# Patient Record
Sex: Female | Born: 1985 | Race: White | Hispanic: No | Marital: Single | Smoking: Never smoker
Health system: Southern US, Community
[De-identification: ages and names within clinical notes are randomized; demographics above are authoritative.]

---

## 2017-01-02 ENCOUNTER — Inpatient Hospital Stay (HOSPITAL_COMMUNITY): Admit: 2017-01-02 | Payer: Self-pay | Admitting: Psychiatry
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
## 2017-01-03 ENCOUNTER — Inpatient Hospital Stay (HOSPITAL_COMMUNITY)
Admission: AD | Admit: 2017-01-03 | Discharge: 2017-01-05 | DRG: 885 | Disposition: A | Discharge status: Home / Self Care | Payer: Medicaid Other | Source: Intra-hospital | Attending: Psychiatry | Admitting: Psychiatry

## 2017-01-03 ENCOUNTER — Encounter (HOSPITAL_COMMUNITY): Payer: Self-pay | Admitting: General Practice

## 2017-01-03 DIAGNOSIS — E669 Obesity, unspecified: Secondary | ICD-10-CM | POA: Diagnosis present

## 2017-01-03 DIAGNOSIS — D6862 Lupus anticoagulant syndrome: Secondary | ICD-10-CM | POA: Diagnosis present

## 2017-01-03 DIAGNOSIS — F419 Anxiety disorder, unspecified: Secondary | ICD-10-CM

## 2017-01-03 DIAGNOSIS — Z6830 Body mass index (BMI) 30.0-30.9, adult: Secondary | ICD-10-CM

## 2017-01-03 DIAGNOSIS — D6859 Other primary thrombophilia: Secondary | ICD-10-CM | POA: Diagnosis present

## 2017-01-03 DIAGNOSIS — Z7901 Long term (current) use of anticoagulants: Secondary | ICD-10-CM | POA: Diagnosis not present

## 2017-01-03 DIAGNOSIS — M329 Systemic lupus erythematosus, unspecified: Secondary | ICD-10-CM | POA: Diagnosis present

## 2017-01-03 DIAGNOSIS — R45851 Suicidal ideations: Secondary | ICD-10-CM | POA: Diagnosis present

## 2017-01-03 DIAGNOSIS — G4700 Insomnia, unspecified: Secondary | ICD-10-CM | POA: Diagnosis not present

## 2017-01-03 DIAGNOSIS — F319 Bipolar disorder, unspecified: Principal | ICD-10-CM | POA: Diagnosis present

## 2017-01-03 DIAGNOSIS — Z86711 Personal history of pulmonary embolism: Secondary | ICD-10-CM

## 2017-01-03 DIAGNOSIS — F410 Panic disorder [episodic paroxysmal anxiety] without agoraphobia: Secondary | ICD-10-CM | POA: Diagnosis present

## 2017-01-03 DIAGNOSIS — F411 Generalized anxiety disorder: Secondary | ICD-10-CM | POA: Diagnosis present

## 2017-01-03 DIAGNOSIS — Z86718 Personal history of other venous thrombosis and embolism: Secondary | ICD-10-CM | POA: Diagnosis not present

## 2017-01-03 DIAGNOSIS — Z882 Allergy status to sulfonamides status: Secondary | ICD-10-CM

## 2017-01-03 DIAGNOSIS — Z818 Family history of other mental and behavioral disorders: Secondary | ICD-10-CM | POA: Diagnosis not present

## 2017-01-03 DIAGNOSIS — J45909 Unspecified asthma, uncomplicated: Secondary | ICD-10-CM | POA: Diagnosis present

## 2017-01-03 DIAGNOSIS — F39 Unspecified mood [affective] disorder: Secondary | ICD-10-CM

## 2017-01-03 DIAGNOSIS — Z915 Personal history of self-harm: Secondary | ICD-10-CM | POA: Diagnosis not present

## 2017-01-03 MED ORDER — TRAZODONE HCL 50 MG PO TABS
50.0000 mg | ORAL_TABLET | Freq: Every evening | ORAL | Status: DC | PRN
  Administered 2017-01-03: 50 mg via ORAL
  Filled 2017-01-03: qty 1

## 2017-01-03 MED ORDER — MAGNESIUM HYDROXIDE 400 MG/5ML PO SUSP: 30.0000 mL | Freq: Every day | ORAL | Status: DC | PRN

## 2017-01-03 MED ORDER — ACETAMINOPHEN 325 MG PO TABS: 650.0000 mg | ORAL_TABLET | Freq: Four times a day (QID) | ORAL | Status: DC | PRN

## 2017-01-03 MED ORDER — LORAZEPAM 0.5 MG PO TABS
0.5000 mg | ORAL_TABLET | Freq: Four times a day (QID) | ORAL | Status: DC | PRN
  Administered 2017-01-04: 0.5 mg via ORAL
  Filled 2017-01-03: qty 1

## 2017-01-03 MED ORDER — ARIPIPRAZOLE 5 MG PO TABS
5.0000 mg | ORAL_TABLET | Freq: Every day | ORAL | Status: DC
Start: 2017-01-03 — End: 2017-01-05
  Administered 2017-01-03 – 2017-01-05 (×3): 5 mg via ORAL
  Filled 2017-01-03 (×2): qty 1
  Filled 2017-01-03: qty 7
  Filled 2017-01-03 (×3): qty 1

## 2017-01-03 MED ORDER — BUSPIRONE HCL 5 MG PO TABS
5.0000 mg | ORAL_TABLET | Freq: Three times a day (TID) | ORAL | Status: DC
  Administered 2017-01-03 – 2017-01-05 (×7): 5 mg via ORAL
  Filled 2017-01-03: qty 1
  Filled 2017-01-03: qty 21
  Filled 2017-01-03 (×2): qty 1
  Filled 2017-01-03: qty 21
  Filled 2017-01-03 (×4): qty 1
  Filled 2017-01-03: qty 21
  Filled 2017-01-03 (×3): qty 1

## 2017-01-03 MED ORDER — ALUM & MAG HYDROXIDE-SIMETH 200-200-20 MG/5ML PO SUSP: 30.0000 mL | ORAL | Status: DC | PRN

## 2017-01-03 MED ORDER — ENOXAPARIN SODIUM 40 MG/0.4ML ~~LOC~~ SOLN
40.0000 mg | Freq: Every day | SUBCUTANEOUS | Status: DC
  Administered 2017-01-03 – 2017-01-05 (×3): 40 mg via SUBCUTANEOUS
  Filled 2017-01-03 (×5): qty 0.4
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
## 2017-01-03 NOTE — Progress Notes (Signed)
ANTICOAGULATION CONSULT NOTE - Follow Up Consult  Pharmacy Consult for lovenox Indication: DVT prophylaxis  Allergies  Allergen Reactions  . Sulfa Antibiotics Anaphylaxis    Patient Measurements: Height: 5\' 4"  (162.6 cm) Weight: 239 lb (108.4 kg) IBW/kg (Calculated) : 54.7   Vital Signs: Temp: 98.9 F (37.2 C) (09/12 1037) Temp Source: Oral (09/12 1037) BP: 110/68 (09/12 1038) Pulse Rate: 95 (09/12 1038)  Labs: No results for input(s): HGB, HCT, PLT, APTT, LABPROT, INR, HEPARINUNFRC, HEPRLOWMOCWT, CREATININE, CKTOTAL, CKMB, TROPONINI in the last 72 hours.  CrCl cannot be calculated (No order found.).   Medications:  Scheduled:  . ARIPiprazole  5 mg Oral Daily  . busPIRone  5 mg Oral TID  . enoxaparin (LOVENOX) injection  40 mg Subcutaneous Daily    Assessment: Patient on lovenox at home    Plan:  Continue home lovenox 40 mg sq daily   Charyl Dancerayne, Arayah Krouse Marie 01/03/2017,3:01 PM
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
## 2017-01-03 NOTE — Progress Notes (Signed)
Full consult note to follow. 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 Hospitalist consulted for previous history and medication requiring chronic anticoagulation. Patient states that she's never been on oral medications although I suspect that she was not on Coumadin because of pregnancy. We'll discuss with hematology, but patient may do better on Coumadin rather than Lovenox in terms of cost. Will confirm.
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
## 2017-01-03 NOTE — Tx Team (Signed)
Initial Treatment Plan 01/03/2017 12:39 PM Katherine Skinner ZOX:[id number]    PATIENT STRESSORS: Financial difficulties Medication change or noncompliance Occupational concerns   PATIENT STRENGTHS: Wellsite geologistCommunication skills General fund of knowledge Work skills   PATIENT IDENTIFIED PROBLEMS: "My Bipolar"  "Work on getting back on medications"  Transportation issues  New to this state               DISCHARGE CRITERIA:  Improved stabilization in mood, thinking, and/or behavior Safe-care adequate arrangements made Verbal commitment to aftercare and medication compliance  PRELIMINARY DISCHARGE PLAN: Outpatient therapy Return to previous living arrangement  PATIENT/FAMILY INVOLVEMENT: This treatment plan has been presented to and reviewed with the patient, Katherine Skinner.  The patient and family have been given the opportunity to ask questions and make suggestions.  Larina Earthlyopson, Allisson Schindel E, RN 01/03/2017, 12:39 PM

## 2017-01-03 NOTE — Progress Notes (Signed)
Recreation Therapy Notes  Date: 01/03/17 Time: 0930 Location: [address] Dayroom  Group Topic: Stress Management  Goal Area(s) Addresses:  Patient will verbalize importance of using healthy stress management.  Patient will identify positive emotions associated with healthy stress management.   Intervention: Stress Management  Activity :  Guided Imagery.  LRT introduced the stress management technique of guided imagery.  LRT read a script to allow patients to participate in the activity.  Patients were to follow along as LRT read the script to engage in the activity.  Education:  Stress Management, Discharge Planning.   Education Outcome: Acknowledges edcuation/In group clarification offered/Needs additional education  Clinical Observations/Feedback: Pt did not attend group.   Tyiesha Brackney, LRT/CTRS         Zenia Guest A 01/03/2017 12:14 PM 

## 2017-01-03 NOTE — Progress Notes (Signed)
Adult Psychoeducational Group Note  Date:  01/03/2017 Time:  10:17 PM  Group Topic/Focus:  Wrap-Up Group:   The focus of this group is to help patients review their daily goal of treatment and discuss progress on daily workbooks.  Participation Level:  Active  Participation Quality:  Appropriate  Affect:  Appropriate  Cognitive:  Alert  Insight: Appropriate  Engagement in Group:  Engaged  Modes of Intervention:  Discussion  Additional Comments:  Pt stated she had a good day. Her goal is not to feel overwhelmed or have anxiety.   Kaleen OdeaCOOKE, Janssen Zee R 01/03/2017, 10:17 PM

## 2017-01-04 DIAGNOSIS — D6862 Lupus anticoagulant syndrome: Secondary | ICD-10-CM | POA: Diagnosis present

## 2017-01-04 DIAGNOSIS — E669 Obesity, unspecified: Secondary | ICD-10-CM | POA: Diagnosis present

## 2017-01-04 DIAGNOSIS — D6859 Other primary thrombophilia: Secondary | ICD-10-CM | POA: Diagnosis present

## 2017-01-04 LAB — LIPID PANEL
CHOL/HDL RATIO: 4.6 ratio
Cholesterol: 181 mg/dL (ref 0–200)
HDL: 39 mg/dL — ABNORMAL LOW (ref >40)
LDL Cholesterol: 117 mg/dL — ABNORMAL HIGH (ref 0–99)
TRIGLYCERIDES: 126 mg/dL (ref <150)
VLDL: 25 mg/dL (ref 0–40)

## 2017-01-04 LAB — HEMOGLOBIN A1C
Hgb A1c MFr Bld: 5 % (ref 4.8–5.6)
MEAN PLASMA GLUCOSE: 96.8 mg/dL

## 2017-01-04 LAB — TSH: TSH: 2.926 u[IU]/mL (ref 0.350–4.500)

## 2017-01-04 MED ORDER — ONDANSETRON HCL 4 MG PO TABS
4.0000 mg | ORAL_TABLET | Freq: Two times a day (BID) | ORAL | Status: DC | PRN
  Administered 2017-01-04 – 2017-01-05 (×2): 4 mg via ORAL
  Filled 2017-01-04 (×2): qty 1

## 2017-01-04 MED ORDER — ONDANSETRON HCL 4 MG PO TABS
4.0000 mg | ORAL_TABLET | Freq: Two times a day (BID) | ORAL | Status: DC
  Filled 2017-01-04 (×2): qty 1

## 2017-01-04 MED ORDER — TRAZODONE HCL 150 MG PO TABS
75.0000 mg | ORAL_TABLET | Freq: Every evening | ORAL | Status: DC | PRN
Start: 2017-01-04 — End: 2017-01-05
  Administered 2017-01-04: 75 mg via ORAL
  Filled 2017-01-04: qty 4
  Filled 2017-01-04: qty 1

## 2017-01-04 NOTE — Plan of Care (Signed)
Problem: Safety: Goal: Periods of time without injury will increase Outcome: Progressing Pt has not harmed self or others tonight.  She denies SI/HI and verbally contracts for safety.    

## 2017-01-04 NOTE — BHH Group Notes (Signed)
LCSW Group Therapy Note  01/04/2017 1:15pm  Type of Therapy/Topic:  Group Therapy:  Balance in Life  Participation Level:  Active  Description of Group:    This group will address the concept of balance and how it feels and looks when one is unbalanced. Patients will be encouraged to process areas in their lives that are out of balance and identify reasons for remaining unbalanced. Facilitators will guide patients in utilizing problem-solving interventions to address and correct the stressor making their life unbalanced. Understanding and applying boundaries will be explored and addressed for obtaining and maintaining a balanced life. Patients will be encouraged to explore ways to assertively make their unbalanced needs known to significant others in their lives, using other group members and facilitator for support and feedback.  Therapeutic Goals: 1. Patient will identify two or more emotions or situations they have that consume much of in their lives. 2. Patient will identify signs/triggers that life has become out of balance:  3. Patient will identify two ways to set boundaries in order to achieve balance in their lives:  4. Patient will demonstrate ability to communicate their needs through discussion and/or role plays  Summary of Patient Progress: Pt was observed to be lethargic in discussion but participated appropriately. Pt discussed feeling unbalanced due to not being on her medications as well as significant life changes that have left her feeling more socially isolated. Pt reports that she values the routine she has in caring for her 4 small children.    Therapeutic Modalities:   Cognitive Behavioral Therapy Solution-Focused Therapy Assertiveness Training  Verdene LennertLauren C Aarika Moon, LCSW 01/04/2017 3:59 PM
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
## 2017-01-04 NOTE — Progress Notes (Signed)
D: Pt denies SI, HI, AVH and pain. Presents with depressed affect and mood on initial contact, brightened up as the shift progressed. C/O of nausea post noon medication (Buspar).  A: MD informed of pt's complaint of nausea, new order received for Zofran 4 mg Q 12 hrs PRN. Support and encouragement provided to pt. Scheduled and PRN medications administered as ordered with verbal education and effects monitored. Q 30 minutes safety checks maintained.  R: Pt receptive to care and cooperative with unit routines. Compliant with medications when offered. Reports relief from nausea when reassessed. Denies adverse drug reactions when assessed. POC continues for safety and mood stability.

## 2017-01-04 NOTE — Progress Notes (Addendum)
[facility] MD Progress Note  01/04/2017 8:59 AM Katherine Skinner  MRN:  [id number] Subjective:  Patient states she is feeling partially better, and states " my mother also told me I seem better". She states that she feels optimistic medications will help. Denies significant side effects at this time. Objective: I have discussed case with treatment team and have met with patient. As on admission, patient states she spoke with PCP prior to admission with goal of restarting psychiatric medications and that she feels her statements were misconstrued and that she did not mean to convey she had any suicidal intention. States " I have no suicidal thoughts ".  At this time she is future oriented and expressing hope for discharge soon. Visible on unit, behavior in good control, going to groups. Denies medication side effects. Patient has history of coagulopathy, and has been restarted on Lovenox by Hospitalist. At this time denies any bleeding or bruising . Labs reviewed- TSH WNL.     Principal Problem: Bipolar Disorder  Diagnosis:   Patient Active Problem List   Diagnosis Date Noted  . Bipolar 1 disorder ([place]) [F31.9] 01/03/2017   Total Time spent with patient: 20 minutes  Past Medical History: History reviewed. No pertinent past medical history.  Past Surgical History:  Procedure Laterality Date  . CESAREAN SECTION     Family History: History reviewed. No pertinent family history. Social History:  History  Alcohol Use No     History  Drug Use No    Social History   Social History  . Marital status: Single    Spouse name: N/A  . Number of children: N/A  . Years of education: N/A   Social History Main Topics  . Smoking status: Never Smoker  . Smokeless tobacco: Never Used  . Alcohol use No  . Drug use: No  . Sexual activity: Not Currently   Other Topics Concern  . None   Social History Narrative  . None   Additional Social History:    Pain Medications: See MAR Prescriptions: See  MAR Over the Counter: See MAR History of alcohol / drug use?: No history of alcohol / drug abuse Longest period of sobriety (when/how long): NA  Sleep: Good  Appetite:  Good  Current Medications: Current Facility-Administered Medications  Medication Dose Route Frequency Provider Last Rate Last Dose  . acetaminophen (TYLENOL) tablet 650 mg  650 mg Oral Q6H PRN Eappen, Saramma, MD      . alum & mag hydroxide-simeth (MAALOX/MYLANTA) 200-200-20 MG/5ML suspension 30 mL  30 mL Oral Q4H PRN Eappen, Saramma, MD      . ARIPiprazole (ABILIFY) tablet 5 mg  5 mg Oral Daily Zackery Brine, Myer Peer, MD   5 mg at 01/04/17 8916  . busPIRone (BUSPAR) tablet 5 mg  5 mg Oral TID Sumaiyah Markert, Myer Peer, MD   5 mg at 01/04/17 9450  . enoxaparin (LOVENOX) injection 40 mg  40 mg Subcutaneous Daily Eappen, Ria Clock, MD   40 mg at 01/04/17 3888  . LORazepam (ATIVAN) tablet 0.5 mg  0.5 mg Oral Q6H PRN Connar Keating A, MD      . magnesium hydroxide (MILK OF MAGNESIA) suspension 30 mL  30 mL Oral Daily PRN Eappen, Saramma, MD      . traZODone (DESYREL) tablet 50 mg  50 mg Oral QHS PRN Ursula Alert, MD   50 mg at 01/03/17 2106    Lab Results: No results found for this or any previous visit (from the past 48 hour(s)).  Blood Alcohol level:  No results found for: [facility]  Metabolic Disorder Labs: No results found for: HGBA1C, MPG No results found for: PROLACTIN No results found for: CHOL, TRIG, HDL, CHOLHDL, VLDL, LDLCALC  Physical Findings: AIMS: Facial and Oral Movements Muscles of Facial Expression: None, normal Lips and Perioral Area: None, normal Jaw: None, normal Tongue: None, normal,Extremity Movements Upper (arms, wrists, hands, fingers): None, normal Lower (legs, knees, ankles, toes): None, normal, Trunk Movements Neck, shoulders, hips: None, normal, Overall Severity Severity of abnormal movements (highest score from questions above): None, normal Incapacitation due to abnormal movements: None,  normal Patient's awareness of abnormal movements (rate only patient's report): No Awareness, Dental Status Current problems with teeth and/or dentures?: No Does patient usually wear dentures?: No  CIWA:    COWS:     Musculoskeletal: Strength & Muscle Tone: within normal limits Gait & Station: normal Patient leans: N/A  Psychiatric Specialty Exam: Physical Exam  ROS denies chest pain, no shortness of breath, no vomiting   Blood pressure 106/77, pulse (!) 106, temperature 98.4 F (36.9 C), temperature source Oral, resp. rate 16, height 5' 4"  (1.626 m), weight 108.4 kg (239 lb).Body mass index is 41.02 kg/m.  General Appearance: Well Groomed  Eye Contact:  Good  Speech:  Garbled  Volume:  Normal  Mood:  minimizes depression at this time , states mood is "OK"  Affect:  milldy constricted, but smiles at times appropriately   Thought Process:  Linear and Descriptions of Associations: Intact  Orientation:  Other:  fully alert and attentive   Thought Content:  denies hallucinations, no delusions not internally preoccupied   Suicidal Thoughts:  No denies suicidal or self injurious ideations, denies homicidal or violent ideations   Homicidal Thoughts:  No  Memory:  recent and remote grossly intact   Judgement:  Other:  improving   Insight:  improving  Psychomotor Activity:  Normal  Concentration:  Concentration: Good and Attention Span: Good  Recall:  Good  Fund of Knowledge:  Good  Language:  Good  Akathisia:  Negative  Handed:  Right  AIMS (if indicated):     Assets:  Desire for Improvement Resilience  ADL's:  Intact  Cognition:  WNL  Sleep:  Number of Hours: 6.25   Assessment - patient is reporting improved mood and denies any suicidal ideations at this time . She is future oriented, and states she was not having actual suicidal plans or intentions prior to admission. She has identified her family and children as protective factors from considering suicide. Currently  tolerating Abilify and Buspar well, denies side effects.  Requests Trazodone titration to address sub-optimal sleep.    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

## 2017-01-04 NOTE — Consult Note (Signed)
Medical Consultation   Alanie Syler  PPI:[id number]  DOB: 17-Jan-1986  DOA: 01/03/2017  PCP: [facility]   Outpatient Specialists:   None  Requesting physician: Neita Garnet, Psychiatry  Reason for consultation: History of hypercoagulable state requiring anticoagulation   History of Present Illness: Anglea Skinner is an 31 y.o. female with past medical history of reported "lupus" and protein S deficiency who has previously been on Lovenox and recently moved here from [place]. Patient met with her PCP on 9/12 and it was conveyed that the patient was feeling depressed and possibly suicidal. She was then committed to behavioral health [facility]. During her intake, patient related that she has a previous history of lupus and protein S deficiency and presented had been on anticoagulation medication. Hospitalists were called by psychiatry for consultation.   Review of Systems:  ROS Pt complains of fatigue and feeling down, although she denies feeling suicidal  Pt denies any headaches, vision changes, dysphagia, chest pain, palpitations, shortness of breath, wheeze, cough, abdominal pain, hematuria, dysuria, constipation, diarrhea, focal extremity numbness weakness or pain.  Review of systems are otherwise negative.    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 CESAREAN SECTION       Allergies:   Allergies  Allergen Reactions  . Sulfa Antibiotics Anaphylaxis     Social History:  reports that she has never smoked. She has never used smokeless tobacco. She reports that she does not drink alcohol or use drugs. Patient lives at home with her children   Family History: Patient states her blood pressure runs in her family   Physical Exam: Vitals:   01/03/17 1037 01/03/17 1038  01/04/17 0656 01/04/17 0657  BP: 119/75 110/68 113/63 106/77  Pulse: 86 95 74 (!) 106  Resp: 18  16   Temp: 98.9 F (37.2 C)  98.4 F (36.9 C)   TempSrc: Oral  Oral   Weight: 108.4 kg (239 lb)     Height: 5' 4" (1.626 m)       Constitutional: Alert & oriented x 3, NAD Eyes: Sclera nonicteric, extraocular movements are intact ENMT: Normocephalic and atraumatic, mucous membranes are moist  Neck: Supple, no JVD CVS: Regular rate and rhythm, S1 and S2 Respiratory:  Clear to auscultation bilaterally Abdomen: Soft, nontender, nondistended, positive bowel sounds Musculoskeletal: : No clubbing or cyanosis or edema Neuro: No focal deficits Psych: Appropriate, no evidence of psychoses. Normal affect Skin: No skin breaks, tears or lesions   Data reviewed:  I have personally reviewed following labs and imaging studies Labs:  Hgb A1c  Recent Labs  01/04/17 0622  HGBA1C 5.0   Lipid Profile  Recent Labs  01/04/17 0622  CHOL 181  HDL 39*  LDLCALC 117*  TRIG 126  CHOLHDL 4.6   Thyroid function studies  Recent Labs  01/04/17 0622  TSH 2.926   Anemia work up No results for input(s): VITAMINB12, FOLATE, FERRITIN, TIBC, IRON, RETICCTPCT in the last 72 hours. Urinalysis No results found for: COLORURINE, APPEARANCEUR, LABSPEC, [place], GLUCOSEU, HGBUR, BILIRUBINUR, KETONESUR, PROTEINUR, UROBILINOGEN, NITRITE, Strong   Microbiology No results found for this or any previous visit (from the past 240 hour(s)).     Inpatient Medications:   Scheduled Meds: . ARIPiprazole  5 mg Oral Daily  . busPIRone  5 mg Oral TID  . enoxaparin (LOVENOX) injection  40 mg Subcutaneous Daily   Continuous Infusions:   Radiological Exams on Admission: No results found.  Impression/Recommendations Active Problems:   Bipolar 1 disorder (HCC) With possible intent to harm herself: Management as per psychiatry    Lupus anticoagulant disorder (HCC)/protein S deficiency: To clarified,  patient, most likely does not have lupus (SLE).  In taking the patient's history, she was diagnosed several years ago. The only medications that she has ever been on our medicines for depression and anticoagulation. At time of diagnosis, she was told that she would need to be on anticoagulation. I would expect if she formally had systemic lupus, she would've been told about it in more detail as well as all the consequences rather than just being told she is at risk for blood clot. At some point, she was told that she also has protein S deficiency. Questionable whether she has one or both. She also tells me that she has never been on oral anticoagulation medication and only on Lovenox. Reportedly she was told that she could not be on the oral medications. However, I think she misinterpreted this thinking that she could not be on oral anticoagulation medications because they would not properly be able to treat her and rather it sounds like they were having to put her on anticoagulation but she was pregnant which would be in a contraindication to Coumadin.  She has since been started on Lovenox here at behavioral health. Since she has, not able to get a lupus anticoagulant or protein S deficiency test, the results become invalidated if someone has an acute thrombus or on anticoagulation. However, we will check a double-stranded DNA test, goal standard for confirming if someone has SLE. I would expect this test to be negative.  As far as anticoagulation long-term, given the patient's recurrent history of DVT and PE, she certainly does need to be on anticoagulation. She [place] [employer] and I have put in a request for case management to see if continuous daily Lovenox will be fully covered by this. She likely will need to be on Coumadin instead. She has no contraindications for Coumadin, and she has a PCP who can follow-up with PT/INR.  Coumadin can be started by her PCP in the outpatient setting and does not  necessarily have to be started here.    Obesity (BMI 30-39.9): Patient meets criteria with BMI greater than 30    KRISHNAN,SENDIL K M.D. [facility] [email or web address] Password [email or web address]  01/04/2017, 12:46 PM       

## 2017-01-04 NOTE — Progress Notes (Signed)
Pt attend wrap u group. Her day was a 8. Her goal was to work on anxiety. She feels overwhelming.

## 2017-01-04 NOTE — Progress Notes (Signed)
D: Pt was in the dayroom upon initial approach.  Pt presents with depressed affect and mood.  Describes her day as "good."  She reports her goal is to "not have the overwhelming and depressing thoughts" and "talking more."  Pt reports she attended group today.  Pt denies SI/HI, denies hallucinations, denies pain.  Pt has been visible in milieu interacting with peers and staff appropriately.  Pt attended evening group.    A: Introduced self to pt.  Actively listened to pt and offered support and encouragement. PRN medication administered for sleep.  Q15 minute safety checks maintained.  R: Pt is safe on the unit.  Pt verbally contracts for safety.  Will continue to monitor and assess.

## 2017-01-05 LAB — ANTI-DNA ANTIBODY, DOUBLE-STRANDED

## 2017-01-05 MED ORDER — ARIPIPRAZOLE 5 MG PO TABS: 5.0000 mg | ORAL_TABLET | Freq: Every day | ORAL | 0 refills | Status: AC

## 2017-01-05 MED ORDER — BUSPIRONE HCL 5 MG PO TABS: 5.0000 mg | ORAL_TABLET | Freq: Three times a day (TID) | ORAL | 0 refills | Status: AC

## 2017-01-05 MED ORDER — TRAZODONE HCL 150 MG PO TABS: 75.0000 mg | ORAL_TABLET | Freq: Every evening | ORAL | 0 refills | Status: AC | PRN

## 2017-01-05 MED ORDER — ARIPIPRAZOLE 5 MG PO TABS: 5.0000 mg | ORAL_TABLET | Freq: Every day | ORAL | 0 refills | Status: DC

## 2017-01-05 NOTE — Plan of Care (Signed)
Problem: Activity: Goal: Sleeping patterns will improve Outcome: Progressing Slept 6.25 hours last night according to flowsheet.    
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
## 2017-01-05 NOTE — Tx Team (Signed)
Interdisciplinary Treatment and Diagnostic Plan Update  01/05/2017 Time of Session: 9:30am Katherine Skinner MRN: [id number]  Principal Diagnosis: Bipolar Disorder, Depressed   Secondary Diagnoses: Active Problems:   Bipolar 1 disorder (HCC)   Lupus anticoagulant disorder (HCC)   Protein S deficiency (HCC)   Obesity (BMI 30-39.9)   Current Medications:  Current Facility-Administered Medications  Medication Dose Route Frequency Provider Last Rate Last Dose  . acetaminophen (TYLENOL) tablet 650 mg  650 mg Oral Q6H PRN Eappen, Saramma, MD      . alum & mag hydroxide-simeth (MAALOX/MYLANTA) 200-200-20 MG/5ML suspension 30 mL  30 mL Oral Q4H PRN Eappen, Saramma, MD      . ARIPiprazole (ABILIFY) tablet 5 mg  5 mg Oral Daily Cobos, Rockey Situ, MD   5 mg at 01/05/17 0851  . busPIRone (BUSPAR) tablet 5 mg  5 mg Oral TID Cobos, Rockey Situ, MD   5 mg at 01/05/17 0851  . enoxaparin (LOVENOX) injection 40 mg  40 mg Subcutaneous Daily Eappen, Levin Bacon, MD   40 mg at 01/05/17 0851  . LORazepam (ATIVAN) tablet 0.5 mg  0.5 mg Oral Q6H PRN Cobos, Rockey Situ, MD   0.5 mg at 01/04/17 2057  . magnesium hydroxide (MILK OF MAGNESIA) suspension 30 mL  30 mL Oral Daily PRN Eappen, Levin Bacon, MD      . ondansetron (ZOFRAN) tablet 4 mg  4 mg Oral BID PRN Cobos, Rockey Situ, MD   4 mg at 01/04/17 1454  . traZODone (DESYREL) tablet 75 mg  75 mg Oral QHS PRN Cobos, Rockey Situ, MD   75 mg at 01/04/17 2057    PTA Medications: Prescriptions Prior to Admission  Medication Sig Dispense Refill Last Dose  . enoxaparin (LOVENOX) 40 MG/0.4ML injection Inject 40 mg into the skin daily.   01/02/2017 at 0700    Treatment Modalities: Medication Management, Group therapy, Case management,  1 to 1 session with clinician, Psychoeducation, Recreational therapy.  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: Bipolar Disorder, Depressed  Long Term Goal(s): Improvement in symptoms so as ready for discharge  Short Term Goals: Ability to identify changes in lifestyle to reduce recurrence of condition will improve Ability to maintain clinical measurements within normal limits will improve Compliance with prescribed medications will improve Ability to identify changes in lifestyle to reduce recurrence of condition will improve Ability to maintain clinical measurements within normal limits will improve Compliance with prescribed medications will improve  Medication Management: Evaluate patient's response, side effects, and tolerance of medication regimen.  Therapeutic Interventions: 1 to 1 sessions, Unit Group sessions and Medication administration.  Evaluation of Outcomes: Adequate for Discharge  Physician Treatment Plan for Secondary Diagnosis: Active Problems:   Bipolar 1 disorder (HCC)   Lupus anticoagulant disorder (HCC)   Protein S deficiency (HCC)   Obesity (BMI 30-39.9)   Long Term Goal(s): Improvement in symptoms so as ready for discharge  Short Term Goals: Ability to identify changes in lifestyle to reduce recurrence of condition will improve Ability to maintain clinical measurements within normal limits will improve Compliance with prescribed medications will improve Ability to identify changes in lifestyle to reduce recurrence of condition will improve Ability to maintain clinical measurements within normal limits will improve Compliance with prescribed medications will improve  Medication Management: Evaluate patient's response, side effects, and tolerance of medication regimen.  Therapeutic Interventions: 1 to 1 sessions,  Unit Group sessions and Medication administration.  Evaluation of Outcomes: Adequate for Discharge   RN Treatment Plan for Primary Diagnosis: Bipolar Disorder, Depressed  Long Term Goal(s):  Knowledge of disease and therapeutic regimen to maintain health will improve  Short Term Goals: Ability to disclose and discuss suicidal ideas, Ability to identify and develop effective coping behaviors will improve and Compliance with prescribed medications will improve  Medication Management: RN will administer medications as ordered by provider, will assess and evaluate patient's response and provide education to patient for prescribed medication. RN will report any adverse and/or side effects to prescribing provider.  Therapeutic Interventions: 1 on 1 counseling sessions, Psychoeducation, Medication administration, Evaluate responses to treatment, Monitor vital signs and CBGs as ordered, Perform/monitor CIWA, COWS, AIMS and Fall Risk screenings as ordered, Perform wound care treatments as ordered.  Evaluation of Outcomes: Adequate for Discharge   LCSW Treatment Plan for Primary Diagnosis: Bipolar Disorder, Depressed  Long Term Goal(s): Safe transition to appropriate next level of care at discharge, Engage patient in therapeutic group addressing interpersonal concerns.  Short Term Goals: Engage patient in aftercare planning with referrals and resources, Identify triggers associated with mental health/substance abuse issues and Increase skills for wellness and recovery  Therapeutic Interventions: Assess for all discharge needs, 1 to 1 time with Social worker, Explore available resources and support systems, Assess for adequacy in community support network, Educate family and significant other(s) on suicide prevention, Complete Psychosocial Assessment, Interpersonal group therapy.  Evaluation of Outcomes: Adequate for Discharge   Progress in Treatment: Attending groups: Yes  Participating in groups: Yes Taking medication as prescribed: Yes, MD continues to assess for medication changes as needed Toleration medication: Yes, no side effects reported at this time Family/Significant other  contact made: No, CSW attempted to make contact with mother Patient understands diagnosis: Yes AEB willingness to seek treatment Discussing patient identified problems/goals with staff: Yes Medical problems stabilized or resolved: Yes Denies suicidal/homicidal ideation: Yes Issues/concerns per patient self-inventory: None Other: N/A  New problem(s) identified: None identified at this time.   New Short Term/Long Term Goal(s): "get back home to my kids"  Discharge Plan or Barriers: Pt will return home and follow-up with outpatient services at [facility]  Reason for Continuation of Hospitalization: None identified at this time.   Estimated Length of Stay: 0 days; Pt stable for DC today  Attendees: Patient:  01/05/2017  9:59 AM  Physician: Dr. Jama Flavors, MD 01/05/2017  9:59 AM  Nursing: Boyd Kerbs, RN 01/05/2017  9:59 AM  RN Care Manager:  01/05/2017  9:59 AM  Social Worker: Vernie Shanks, LCSW 01/05/2017  9:59 AM  Recreational Therapist:  01/05/2017  9:59 AM  Other: Armandina Stammer, NP; Reola Calkins, NP 01/05/2017  9:59 AM  Other:  01/05/2017  9:59 AM  Other: 01/05/2017  9:59 AM    Scribe for Treatment Team: Verdene Lennert, LCSW 01/05/2017 9:59 AM

## 2017-01-05 NOTE — Progress Notes (Addendum)
D: Pt was in the hallway upon initial approach.  Pt presents with anxious, depressed affect and mood.  Reports her day "was good 'til my aunt didn't show up with my child."  Pt reports she was looking forward to the visit.  Goal is "no overwhelming and be positive about everything."  Pt reports positive coping skills of writing in journal and coloring.  Pt denies SI/HI, denies hallucinations, denies pain.  Pt has been visible in milieu interacting with peers and staff appropriately.  Pt attended evening group.    A: Actively listened to pt and offered support and encouragement. Provided pt with journal and pencil.  PRN medication administered for anxiety and sleep.  Fall prevention techniques reviewed with pt, pt verbalized understanding.  Q15 minute safety checks maintained.  R: Pt is safe on the unit.  Pt is compliant with medications.  Pt verbally contracts for safety.  Will continue to monitor and assess.

## 2017-01-05 NOTE — [facility] Suicide Risk Assessment (Signed)
[facility] INPATIENT:  Family/Significant Other Suicide Prevention Education  Suicide Prevention Education:  Contact Attempts: Korine Winton, Pt's mother [phone], has been identified by the patient as the family member/significant other with whom the patient will be residing, and identified as the person(s) who will aid the patient in the event of a mental health crisis.  With written consent from the patient, two attempts were made to provide suicide prevention education, prior to and/or following the patient's discharge.  We were unsuccessful in providing suicide prevention education.  A suicide education pamphlet was given to the patient to share with family/significant other.  Date and time of first attempt: 01/04/17 @ 1305 Date and time of second attempt: 01/05/17 @ 0950  Verdene Lennert 01/05/2017, 9:49 AM

## 2017-01-05 NOTE — Progress Notes (Signed)
Recreation Therapy Notes  Date: 01/05/17 Time: 0930 Location: 300 Hall Group Room  Group Topic: Stress Management  Goal Area(s) Addresses:  Patient will verbalize importance of using healthy stress management.  Patient will identify positive emotions associated with healthy stress management.   Intervention: Stress Management  Activity :  Meditation.  LRT introduced the stress management technique of meditation.  LRT played a meditation from the Calm app to allow patients to engage in meditation to focus on trying to center their thoughts.  Education:  Stress Management, Discharge Planning.   Education Outcome: Acknowledges edcuation/In group clarification offered/Needs additional education  Clinical Observations/Feedback: Pt did not attend group.   Dariya Gainer, LRT/CTRS         Regine Christian A 01/05/2017 12:57 PM 

## 2017-01-05 NOTE — Progress Notes (Signed)
  [facility] Adult Case Management Discharge Plan :  Will you be returning to the same living situation after discharge:  Yes,  Pt returning home At discharge, do you have transportation home?: Yes,  Pt family to pick up Do you have the ability to pay for your medications: Yes,  Pt provided weith samples and prescriptions  Release of information consent forms completed and in the chart;  Patient's signature needed at discharge.  Patient to Follow up at: Follow-up Information    [facility] Follow up on 01/09/2017.   Why:  at 9:45am for your hospital discharge appointment. Please bring a photo ID, insurance card, proof of household income, and social security card.  Contact information: [address] [place] [address] [phone]           Next level of care provider has access to [facility] Link:no  Safety Planning and Suicide Prevention discussed: Yes,  with Pt; 2 unsuccessful attempts with mother  Have you used any form of tobacco in the last 30 days? (Cigarettes, Smokeless Tobacco, Cigars, and/or Pipes): No  Has patient been referred to the Quitline?: N/A patient is not a smoker  Patient has been referred for addiction treatment: Yes  Verdene Lennert, LCSW 01/05/2017, 9:51 AM

## 2017-01-05 NOTE — [facility] Suicide Risk Assessment (Signed)
[facility] Discharge Suicide Risk Assessment   Principal Problem:  Bipolar Disorder, Depressed  Discharge Diagnoses:  Patient Active Problem List   Diagnosis Date Noted  . 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 Protein S deficiency (HCC) [D68.59] 01/04/2017  . Obesity (BMI 30-39.9) [E66.9] 01/04/2017  . Bipolar 1 disorder (HCC) [F31.9] 01/03/2017    Total Time spent with patient: 30 minutes  Musculoskeletal: Strength & Muscle Tone: within normal limits Gait & Station: normal Patient leans: N/A  Psychiatric Specialty Exam: ROS no headache, no chest pain, no shortness of breath, no vomiting, no rash  Blood pressure 135/89, pulse (!) 106, temperature 98.3 F (36.8 C), temperature source Oral, resp. rate 16, height  (1.626 m), weight 108.4 kg (239 lb).Body mass index is 41.02 kg/m.  General Appearance: Well Groomed  Eye Contact::  Good  Speech:  Normal Rate409  Volume:  Normal  Mood:  improved, states " this is the best I have felt in a while", presents euthymic  Affect:  Appropriate and reactive   Thought Process:  Linear and Descriptions of Associations: Intact  Orientation:  Full (Time, Place, and Person)  Thought Content:  no hallucinations, no delusions, not internally preoccupied   Suicidal Thoughts:  No denies any suicidal or self injurious ideations, no homicidal or violent ideations  Homicidal Thoughts:  No  Memory:  recent and remote grossly intact   Judgement:  Other:  improving   Insight:  improving   Psychomotor Activity:  Normal  Concentration:  Good  Recall:  Good  Fund of Knowledge:Good  Language: Good  Akathisia:  No  Handed:  Right  AIMS (if indicated):     Assets:  Communication Skills Desire for Improvement Resilience  Sleep:  Number of Hours: 6.25  Cognition: WNL  ADL's:  Intact   Mental Status Per Nursing Assessment::   On Admission:  NA  Demographic Factors:  31 year old female , has 4 children,employed   Loss  Factors: Work related stressors, limited support network  Historical Factors: History of Bipolar Disorder diagnosis in the past, states she has never attempted suicide   Risk Reduction Factors:   Responsible for children under 55 years of age, Sense of responsibility to family, Employed and Positive coping skills or problem solving skills  Continued Clinical Symptoms:  At this time patient is alert, attentive, well related, pleasant, mood improved, denies feeling depressed, affect appropriate, reactive, no thought disorder, no suicidal or self injurious ideations, no homicidal or violent ideations, no psychotic symptoms, future oriented. Denies medication side effects- side effects discussed  Behavior on unit calm and in good control. Pleasant on approach.  Cognitive Features That Contribute To Risk:  No gross cognitive deficits noted upon discharge. Is alert , attentive, and oriented x 3   Suicide Risk:  Mild:  Suicidal ideation of limited frequency, intensity, duration, and specificity.  There are no identifiable plans, no associated intent, mild dysphoria and related symptoms, good self-control (both objective and subjective assessment), few other risk factors, and identifiable protective factors, including available and accessible social support.  Follow-up Information    [facility] Follow up on 01/09/2017.   Why:  at 9:45am for your hospital discharge appointment. Please bring a photo ID, insurance card, proof of household income, and social security card.  Contact information: [address] [place] [address] [phone]           Plan Of Care/Follow-up recommendations:  Activity:  as tolerated  Diet:  regular  Tests:  NA Other:  see below  Patient is leaving unit in good spirits  States aunt is picking her up later today Plans to return home Follow up as above . She has a PCP at [facility] for management as needed  Craige Cotta, MD 01/05/2017, 1:42 PM

## 2017-07-23 ENCOUNTER — Other Ambulatory Visit: Payer: Medicaid Other

## 2017-07-24 ENCOUNTER — Encounter: Payer: Self-pay | Admitting: Neurology

## 2017-08-28 ENCOUNTER — Ambulatory Visit: Payer: Medicaid Other | Admitting: Neurology

## 2017-08-30 ENCOUNTER — Encounter: Payer: Self-pay | Admitting: Neurology

## 2017-08-30 ENCOUNTER — Ambulatory Visit: Payer: Medicaid Other | Admitting: Neurology

## 2017-08-30 ENCOUNTER — Telehealth: Payer: Self-pay | Admitting: Neurology

## 2017-08-30 VITALS — BP 107/72 | HR 71 | Ht 64.0 in | Wt 263.0 lb

## 2017-08-30 DIAGNOSIS — D6859 Other primary thrombophilia: Secondary | ICD-10-CM | POA: Diagnosis not present

## 2017-08-30 DIAGNOSIS — IMO0002 Reserved for concepts with insufficient information to code with codable children: Secondary | ICD-10-CM | POA: Insufficient documentation

## 2017-08-30 DIAGNOSIS — R4020 Unspecified coma: Secondary | ICD-10-CM

## 2017-08-30 DIAGNOSIS — D6862 Lupus anticoagulant syndrome: Secondary | ICD-10-CM

## 2017-08-30 MED ORDER — LAMOTRIGINE 100 MG PO TABS: 100.0000 mg | ORAL_TABLET | Freq: Two times a day (BID) | ORAL | 11 refills | Status: AC

## 2017-08-30 MED ORDER — LAMOTRIGINE 25 MG PO TABS: 25.0000 mg | ORAL_TABLET | Freq: Every day | ORAL | 0 refills | Status: AC

## 2017-08-30 NOTE — Progress Notes (Signed)
PATIENT: Shaquaya Wuellner DOB: [birth year]  Chief Complaint  Patient presents with  . New Patient (Initial Visit)    Ref by: Cletis Athens, FNP at [facility]  . Loss of Consciousness    vertigo was ruled out by ENT.      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, she is a mother of 4 young children at age 88, 56, 19,6,  patient stated that she does not have any other family support,apparently has a case manager involving her case,  She had a severe flareup of her depression manic episode in September 2018, there was major changes in her antidepression, currently on polypharmacy treatment, this includes BuSpar 5 mg 3 times a day, 15 mg daily, 150 mg as needed at bedtime, and Abilify 5 mg daily,  She reported multiple recurrent spells of episode of sudden onset eyes rolled back, increased frequency since January 2019, she worked at [employer] job, in 1 week, she is searching for spells, few episode happened at her workplace.   Initial episode was in January 2019, she was standing beside the convey belt, suddenly felt dizzy, sweaty, she was able to turn around to tell her coworker, then developed blurry vision, passed out, woke up in the break room, bite the tip of her tongue, she woke up confused, could not remember her coworkers name, has difficulty breathing, was taken by ambulance to [facility], palpation, no imaging or laboratory evaluation was performed, she was diagnosed with vertigo,  She had similar episode, sometimes in sitting position in the next  following days, had 4 spells in 1 week, oftentimes the spells are preceded by feeling shortness of breath, heart palpitation, chest pressure, was told by her coworker, her eyes rolled back, but there was no description of seizure activity.  Most recent episode was in March 2019, after drop of her young children to daycare, she went shopping at [employer], on her way home, walking on the street, without warning signs, she passed all her history, was brought home by bystanders.  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  ALLERGIES: Allergies  Allergen Reactions  . Sulfa Antibiotics Anaphylaxis    HOME MEDICATIONS: Current Outpatient Medications  Medication Sig Dispense Refill  . ARIPiprazole (ABILIFY) 5 MG tablet Take 1 tablet (5 mg total) by mouth daily. For mood control 30 tablet 0  . busPIRone (BUSPAR) 5 MG tablet Take 1 tablet (5 mg total) by mouth 3 (three) times daily. For anxiety 90 tablet 0  . enoxaparin (LOVENOX) 40 MG/0.4ML injection Inject 40 mg into the skin daily.    Marland Kitchen  sertraline (ZOLOFT) 50 MG tablet Take 50 mg by mouth daily.  2  . traZODone (DESYREL) 150 MG tablet Take 0.5 tablets (75 mg total) by mouth at bedtime as needed for sleep. 15 tablet 0   No current facility-administered medications for this visit.     PAST MEDICAL HISTORY: No past medical history on  file.  PAST SURGICAL HISTORY: Past Surgical History:  Procedure Laterality Date  . CESAREAN SECTION      FAMILY HISTORY: No family history on file.  SOCIAL HISTORY:  Social History   Socioeconomic History  . Marital status: Single    Spouse name: Not on file  . Number of children: Not on file  . Years of education: Not on file  . Highest education level: Not on file  Occupational History  . Not on file  Social Needs  . Financial resource strain: Not on file  . Food insecurity:    Worry: Not on file    Inability: Not on file  . Transportation needs:    Medical: Not on file    Non-medical: Not on file  Tobacco Use  . Smoking status: Never Smoker  . Smokeless tobacco: Never Used  Substance and Sexual Activity  . Alcohol use: No  . Drug use: No  . Sexual activity: Not Currently  Lifestyle  . Physical activity:    Days per week: Not on file    Minutes per session: Not on file  . Stress: Not on file  Relationships  . Social connections:    Talks on phone: Not on file    Gets together: Not on file    Attends religious service: Not on file    Active member of club or organization: Not on file    Attends meetings of clubs or organizations: Not on file    Relationship status: Not on file  . Intimate partner violence:    Fear of current or ex partner: Not on file    Emotionally abused: Not on file    Physically abused: Not on file    Forced sexual activity: Not on file  Other Topics Concern  . Not on file  Social History Narrative  . Not on file     PHYSICAL EXAM   Vitals:   08/30/17 0851  BP: 107/72  Pulse: 71  Weight: 263 lb (119.3 kg)  Height:  (1.626 m)    Not recorded      Body mass index is 45.14 kg/m.  PHYSICAL EXAMNIATION:  Gen: NAD, conversant, well nourised, obese, well groomed                     Cardiovascular: Regular rate rhythm, no peripheral edema, warm, nontender. Eyes: Conjunctivae clear without exudates or  hemorrhage Neck: Supple, no carotid bruits. Pulmonary: Clear to auscultation bilaterally   NEUROLOGICAL EXAM:  MENTAL STATUS: Speech:    Speech is normal; fluent and spontaneous with normal comprehension.  Cognition:     Orientation to time, place and person     Normal recent and remote memory     Normal Attention span and concentration     Normal Language, naming, repeating,spontaneous speech     Fund of knowledge   CRANIAL NERVES: CN II: Visual fields are full to confrontation. Fundoscopic exam is normal with sharp discs and no vascular changes. Pupils are round equal and briskly reactive to light. CN III, IV, VI: extraocular movement are normal. No ptosis. CN V: Facial sensation is intact to  pinprick in all 3 divisions bilaterally. Corneal responses are intact.  CN VII: Face is symmetric with normal eye closure and smile. CN VIII: Hearing is normal to rubbing fingers CN IX, X: Palate elevates symmetrically. Phonation is normal. CN XI: Head turning and shoulder shrug are intact CN XII: Tongue is midline with normal movements and no atrophy.  MOTOR: She has frequent motor tics, shrugging her shoulders There is no pronator drift of out-stretched arms. Muscle bulk and tone are normal. Muscle strength is normal.  REFLEXES: Reflexes are 2+ and symmetric at the biceps, triceps, knees, and ankles. Plantar responses are flexor.  SENSORY: Intact to light touch, pinprick, positional sensation and vibratory sensation are intact in fingers and toes.  COORDINATION: Rapid alternating movements and fine finger movements are intact. There is no dysmetria on finger-to-nose and heel-knee-shin.    GAIT/STANCE: Posture is normal. Gait is steady with normal steps, base, arm swing, and turning. Heel and toe walking are normal. Tandem gait is normal.  Romberg is absent.   DIAGNOSTIC DATA (LABS, IMAGING, TESTING) - I reviewed patient records, labs, notes, testing and imaging myself where  available.   ASSESSMENT AND PLAN  Viann Nielson is a 32 y.o. female   Passing out spells, most recent one in March 2019  Seizure versus syncope, she did report frequent heart palpitations, shortness of breath, chest pressure before passing out  MRI of the brain with and without contrast  EEG  Laboratory evaluations  Refer her to cardiologist  Empirically treat her with titrating dose of lamotrigine to 100 mg twice a day  No driving until episodes free for 6 months      Levert Feinstein, M.D. Ph.D.  [facility] [address], [address] [place], [place] [address] Ph: [phone] Fax: [phone]  CC: [facility]

## 2017-08-30 NOTE — Telephone Encounter (Signed)
Medicaid order sent to GI. They obtain the auth and will reach out to the pt to schedule.  °

## 2017-08-31 LAB — CBC WITH DIFFERENTIAL
BASOS ABS: 0 10*3/uL (ref 0.0–0.2)
BASOS: 0 %
EOS (ABSOLUTE): 0.5 10*3/uL — AB (ref 0.0–0.4)
Eos: 5 %
Hematocrit: 38.1 % (ref 34.0–46.6)
Hemoglobin: 12.1 g/dL (ref 11.1–15.9)
Immature Grans (Abs): 0 10*3/uL (ref 0.0–0.1)
Immature Granulocytes: 0 %
LYMPHS ABS: 3.2 10*3/uL — AB (ref 0.7–3.1)
Lymphs: 34 %
MCH: 26.1 pg — AB (ref 26.6–33.0)
MCHC: 31.8 g/dL (ref 31.5–35.7)
MCV: 82 fL (ref 79–97)
MONOS ABS: 0.6 10*3/uL (ref 0.1–0.9)
Monocytes: 6 %
NEUTROS ABS: 5 10*3/uL (ref 1.4–7.0)
NEUTROS PCT: 55 %
RBC: 4.63 x10E6/uL (ref 3.77–5.28)
RDW: 15 % (ref 12.3–15.4)
WBC: 9.2 10*3/uL (ref 3.4–10.8)

## 2017-08-31 LAB — COMPREHENSIVE METABOLIC PANEL
A/G RATIO: 1.5 (ref 1.2–2.2)
ALBUMIN: 4 g/dL (ref 3.5–5.5)
ALK PHOS: 75 IU/L (ref 39–117)
ALT: 37 IU/L — ABNORMAL HIGH (ref 0–32)
AST: 23 IU/L (ref 0–40)
BILIRUBIN TOTAL: 0.3 mg/dL (ref 0.0–1.2)
BUN / CREAT RATIO: 11 (ref 9–23)
BUN: 7 mg/dL (ref 6–20)
CHLORIDE: 103 mmol/L (ref 96–106)
CO2: 22 mmol/L (ref 20–29)
Calcium: 9.3 mg/dL (ref 8.7–10.2)
Creatinine, Ser: 0.61 mg/dL (ref 0.57–1.00)
GFR calc non Af Amer: 120 mL/min/{1.73_m2} (ref >59)
GFR, EST AFRICAN AMERICAN: 139 mL/min/{1.73_m2} (ref >59)
Globulin, Total: 2.6 g/dL (ref 1.5–4.5)
Glucose: 82 mg/dL (ref 65–99)
POTASSIUM: 4.6 mmol/L (ref 3.5–5.2)
Sodium: 140 mmol/L (ref 134–144)
TOTAL PROTEIN: 6.6 g/dL (ref 6.0–8.5)

## 2017-08-31 LAB — HIV ANTIBODY (ROUTINE TESTING W REFLEX): HIV Screen 4th Generation wRfx: NONREACTIVE

## 2017-08-31 LAB — RPR: RPR Ser Ql: NONREACTIVE

## 2017-08-31 LAB — TSH: TSH: 3.35 u[IU]/mL (ref 0.450–4.500)

## 2017-09-04 ENCOUNTER — Telehealth: Payer: Self-pay

## 2017-09-04 NOTE — Telephone Encounter (Signed)
I spoke with patient and she is aware of results by phone. She did not have any questions or concerns at this time.

## 2017-09-27 ENCOUNTER — Ambulatory Visit: Payer: Medicaid Other | Admitting: Neurology

## 2017-09-27 DIAGNOSIS — D6862 Lupus anticoagulant syndrome: Secondary | ICD-10-CM

## 2017-09-27 DIAGNOSIS — D6859 Other primary thrombophilia: Secondary | ICD-10-CM

## 2017-09-27 DIAGNOSIS — IMO0002 Reserved for concepts with insufficient information to code with codable children: Secondary | ICD-10-CM

## 2017-09-27 DIAGNOSIS — R55 Syncope and collapse: Secondary | ICD-10-CM

## 2017-10-05 NOTE — Procedures (Signed)
   HISTORY: 32 years old female, episode of seizure-like activity  TECHNIQUE:  16 channel EEG was performed based on standard 10-16 international system. One channel was dedicated to EKG, which has demonstrates normal sinus rhythm of 66 beats per minutes.  Upon awakening, the posterior background activity was well-developed, in alpha range, 9 Hz, reactive to eye opening and closure.  There was no evidence of epileptiform discharge.  Photic stimulation was performed, which induced a symmetric photic driving.  Hyperventilation was performed, there was no abnormality elicit.  No sleep was achieved.  CONCLUSION: This is a  normal awake EEG.  There is no electrodiagnostic evidence of epileptiform discharge.  Levert FeinsteinYijun Sharrie Self, M.D. Ph.D.  [facility] [address] [place], [place] [address] Phone: [phone] Fax:      [phone]

## 2017-10-11 ENCOUNTER — Telehealth: Payer: Self-pay | Admitting: Neurology

## 2017-10-11 NOTE — Telephone Encounter (Signed)
Pt requesting a call stating she never received the results from her EEG.

## 2017-10-11 NOTE — Telephone Encounter (Signed)
Returned call to patient - she is aware of her normal EEG results.

## 2017-11-15 ENCOUNTER — Ambulatory Visit: Payer: Self-pay | Admitting: Cardiology

## 2017-11-16 ENCOUNTER — Encounter: Payer: Self-pay | Admitting: *Deleted

## 2017-12-20 ENCOUNTER — Encounter: Payer: Self-pay | Admitting: Neurology

## 2017-12-20 ENCOUNTER — Ambulatory Visit: Payer: Medicaid Other | Admitting: Neurology

## 2017-12-20 ENCOUNTER — Telehealth: Payer: Self-pay | Admitting: *Deleted

## 2017-12-20 NOTE — Telephone Encounter (Signed)
No showed follow up appointment. 

## 2019-07-03 DIAGNOSIS — D6859 Other primary thrombophilia: Secondary | ICD-10-CM | POA: Diagnosis not present

## 2019-07-03 DIAGNOSIS — Z86718 Personal history of other venous thrombosis and embolism: Secondary | ICD-10-CM

## 2019-07-24 DIAGNOSIS — Z86718 Personal history of other venous thrombosis and embolism: Secondary | ICD-10-CM

## 2020-10-28 ENCOUNTER — Emergency Department (HOSPITAL_COMMUNITY): Payer: Medicaid Other

## 2020-10-28 ENCOUNTER — Other Ambulatory Visit: Payer: Self-pay

## 2020-10-28 ENCOUNTER — Emergency Department (HOSPITAL_COMMUNITY)
Admission: EM | Admit: 2020-10-28 | Discharge: 2020-10-28 | Disposition: A | Discharge status: Home / Self Care | Payer: Medicaid Other | Attending: Emergency Medicine | Admitting: Emergency Medicine

## 2020-10-28 DIAGNOSIS — M7918 Myalgia, other site: Secondary | ICD-10-CM

## 2020-10-28 DIAGNOSIS — J1089 Influenza due to other identified influenza virus with other manifestations: Secondary | ICD-10-CM | POA: Diagnosis not present

## 2020-10-28 DIAGNOSIS — M542 Cervicalgia: Secondary | ICD-10-CM | POA: Diagnosis not present

## 2020-10-28 DIAGNOSIS — M549 Dorsalgia, unspecified: Secondary | ICD-10-CM | POA: Diagnosis present

## 2020-10-28 DIAGNOSIS — T1490XA Injury, unspecified, initial encounter: Secondary | ICD-10-CM

## 2020-10-28 DIAGNOSIS — Y92410 Unspecified street and highway as the place of occurrence of the external cause: Secondary | ICD-10-CM | POA: Diagnosis not present

## 2020-10-28 DIAGNOSIS — R52 Pain, unspecified: Secondary | ICD-10-CM

## 2020-10-28 DIAGNOSIS — Y900 Blood alcohol level of less than 20 mg/100 ml: Secondary | ICD-10-CM | POA: Diagnosis not present

## 2020-10-28 DIAGNOSIS — M546 Pain in thoracic spine: Secondary | ICD-10-CM | POA: Diagnosis not present

## 2020-10-28 DIAGNOSIS — Z20822 Contact with and (suspected) exposure to covid-19: Secondary | ICD-10-CM | POA: Insufficient documentation

## 2020-10-28 DIAGNOSIS — R531 Weakness: Secondary | ICD-10-CM | POA: Insufficient documentation

## 2020-10-28 LAB — CBC
HCT: 36.6 % (ref 36.0–46.0)
Hemoglobin: 12 g/dL (ref 12.0–15.0)
MCH: 27.5 pg (ref 26.0–34.0)
MCHC: 32.8 g/dL (ref 30.0–36.0)
MCV: 83.8 fL (ref 80.0–100.0)
Platelets: 210 10*3/uL (ref 150–400)
RBC: 4.37 MIL/uL (ref 3.87–5.11)
RDW: 13.5 % (ref 11.5–15.5)
WBC: 4.6 10*3/uL (ref 4.0–10.5)
nRBC: 0 % (ref 0.0–0.2)

## 2020-10-28 LAB — COMPREHENSIVE METABOLIC PANEL
ALT: 37 U/L (ref 0–44)
AST: 39 U/L (ref 15–41)
Albumin: 3.2 g/dL — ABNORMAL LOW (ref 3.5–5.0)
Alkaline Phosphatase: 45 U/L (ref 38–126)
Anion gap: 7 (ref 5–15)
BUN: 6 mg/dL (ref 6–20)
CO2: 27 mmol/L (ref 22–32)
Calcium: 8.5 mg/dL — ABNORMAL LOW (ref 8.9–10.3)
Chloride: 104 mmol/L (ref 98–111)
Creatinine, Ser: 0.64 mg/dL (ref 0.44–1.00)
GFR, Estimated: >60 mL/min (ref >60)
Glucose, Bld: 93 mg/dL (ref 70–99)
Potassium: 3.2 mmol/L — ABNORMAL LOW (ref 3.5–5.1)
Sodium: 138 mmol/L (ref 135–145)
Total Bilirubin: 0.5 mg/dL (ref 0.3–1.2)
Total Protein: 6 g/dL — ABNORMAL LOW (ref 6.5–8.1)

## 2020-10-28 LAB — SAMPLE TO BLOOD BANK

## 2020-10-28 LAB — PROTIME-INR
INR: 1 (ref 0.8–1.2)
Prothrombin Time: 13.3 seconds (ref 11.4–15.2)

## 2020-10-28 LAB — RESP PANEL BY RT-PCR (FLU A&B, COVID) ARPGX2
Influenza A by PCR: POSITIVE — AB
Influenza B by PCR: NEGATIVE
SARS Coronavirus 2 by RT PCR: NEGATIVE

## 2020-10-28 LAB — I-STAT CHEM 8, ED
BUN: 6 mg/dL (ref 6–20)
Calcium, Ion: 1.16 mmol/L (ref 1.15–1.40)
Chloride: 102 mmol/L (ref 98–111)
Creatinine, Ser: 0.6 mg/dL (ref 0.44–1.00)
Glucose, Bld: 89 mg/dL (ref 70–99)
HCT: 36 % (ref 36.0–46.0)
Hemoglobin: 12.2 g/dL (ref 12.0–15.0)
Potassium: 3.2 mmol/L — ABNORMAL LOW (ref 3.5–5.1)
Sodium: 139 mmol/L (ref 135–145)
TCO2: 27 mmol/L (ref 22–32)

## 2020-10-28 LAB — LACTIC ACID, PLASMA: Lactic Acid, Venous: 0.8 mmol/L (ref 0.5–1.9)

## 2020-10-28 LAB — ETHANOL: Alcohol, Ethyl (B): <10 mg/dL (ref <10)

## 2020-10-28 MED ORDER — IOHEXOL 300 MG/ML  SOLN
100.0000 mL | Freq: Once | INTRAMUSCULAR | Status: AC | PRN
  Administered 2020-10-28: 100 mL via INTRAVENOUS

## 2020-10-28 MED ORDER — HYDROMORPHONE HCL 1 MG/ML IJ SOLN
0.5000 mg | Freq: Once | INTRAMUSCULAR | Status: AC
  Administered 2020-10-28: 0.5 mg via INTRAVENOUS
  Filled 2020-10-28: qty 1

## 2020-10-28 MED ORDER — POTASSIUM CHLORIDE 10 MEQ/100ML IV SOLN
10.0000 meq | Freq: Once | INTRAVENOUS | Status: AC
  Administered 2020-10-28: 10 meq via INTRAVENOUS
  Filled 2020-10-28: qty 100

## 2020-10-28 MED ORDER — KETOROLAC TROMETHAMINE 15 MG/ML IJ SOLN
15.0000 mg | Freq: Once | INTRAMUSCULAR | Status: AC
  Administered 2020-10-28: 15 mg via INTRAVENOUS
  Filled 2020-10-28: qty 1
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
